# Patient Record
Sex: Female | Born: 1980 | Race: Black or African American | Hispanic: No | Marital: Single
Health system: Southern US, Community
[De-identification: ages and names within clinical notes are randomized; demographics above are authoritative.]

---

## 2019-10-19 ENCOUNTER — Emergency Department (HOSPITAL_COMMUNITY): Payer: BC Managed Care – PPO

## 2019-10-19 ENCOUNTER — Emergency Department (HOSPITAL_COMMUNITY)
Admission: EM | Admit: 2019-10-19 | Discharge: 2019-10-19 | Disposition: A | Discharge status: Home / Self Care | Payer: BC Managed Care – PPO | Attending: Emergency Medicine | Admitting: Emergency Medicine

## 2019-10-19 ENCOUNTER — Other Ambulatory Visit: Payer: Self-pay

## 2019-10-19 ENCOUNTER — Telehealth: Payer: Self-pay | Admitting: *Deleted

## 2019-10-19 DIAGNOSIS — S40811A Abrasion of right upper arm, initial encounter: Secondary | ICD-10-CM | POA: Insufficient documentation

## 2019-10-19 DIAGNOSIS — M542 Cervicalgia: Secondary | ICD-10-CM | POA: Insufficient documentation

## 2019-10-19 DIAGNOSIS — Y92410 Unspecified street and highway as the place of occurrence of the external cause: Secondary | ICD-10-CM | POA: Insufficient documentation

## 2019-10-19 DIAGNOSIS — M545 Low back pain: Secondary | ICD-10-CM | POA: Insufficient documentation

## 2019-10-19 DIAGNOSIS — R0789 Other chest pain: Secondary | ICD-10-CM | POA: Insufficient documentation

## 2019-10-19 DIAGNOSIS — S40812A Abrasion of left upper arm, initial encounter: Secondary | ICD-10-CM | POA: Diagnosis not present

## 2019-10-19 DIAGNOSIS — Y999 Unspecified external cause status: Secondary | ICD-10-CM | POA: Insufficient documentation

## 2019-10-19 DIAGNOSIS — Y9389 Activity, other specified: Secondary | ICD-10-CM | POA: Insufficient documentation

## 2019-10-19 DIAGNOSIS — Z79899 Other long term (current) drug therapy: Secondary | ICD-10-CM | POA: Insufficient documentation

## 2019-10-19 DIAGNOSIS — S4992XA Unspecified injury of left shoulder and upper arm, initial encounter: Secondary | ICD-10-CM | POA: Diagnosis present

## 2019-10-19 LAB — CBC WITH DIFFERENTIAL/PLATELET
Abs Immature Granulocytes: 0.07 10*3/uL (ref 0.00–0.07)
Basophils Absolute: 0 10*3/uL (ref 0.0–0.1)
Basophils Relative: 0 %
Eosinophils Absolute: 0 10*3/uL (ref 0.0–0.5)
Eosinophils Relative: 0 %
HCT: 38.4 % (ref 36.0–46.0)
Hemoglobin: 13.8 g/dL (ref 12.0–15.0)
Immature Granulocytes: 1 %
Lymphocytes Relative: 15 %
Lymphs Abs: 1.9 10*3/uL (ref 0.7–4.0)
MCH: 31.4 pg (ref 26.0–34.0)
MCHC: 35.9 g/dL (ref 30.0–36.0)
MCV: 87.5 fL (ref 80.0–100.0)
Monocytes Absolute: 0.5 10*3/uL (ref 0.1–1.0)
Monocytes Relative: 4 %
Neutro Abs: 9.9 10*3/uL — ABNORMAL HIGH (ref 1.7–7.7)
Neutrophils Relative %: 80 %
Platelets: 361 10*3/uL (ref 150–400)
RBC: 4.39 MIL/uL (ref 3.87–5.11)
RDW: 12.9 % (ref 11.5–15.5)
WBC: 12.4 10*3/uL — ABNORMAL HIGH (ref 4.0–10.5)
nRBC: 0 % (ref 0.0–0.2)

## 2019-10-19 LAB — URINALYSIS, ROUTINE W REFLEX MICROSCOPIC
Bilirubin Urine: NEGATIVE
Glucose, UA: NEGATIVE mg/dL
Hgb urine dipstick: NEGATIVE
Ketones, ur: 20 mg/dL — AB
Leukocytes,Ua: NEGATIVE
Nitrite: NEGATIVE
Protein, ur: 30 mg/dL — AB
Specific Gravity, Urine: 1.023 (ref 1.005–1.030)
pH: 7 (ref 5.0–8.0)

## 2019-10-19 LAB — COMPREHENSIVE METABOLIC PANEL
ALT: 19 U/L (ref 0–44)
AST: 17 U/L (ref 15–41)
Albumin: 3.9 g/dL (ref 3.5–5.0)
Alkaline Phosphatase: 56 U/L (ref 38–126)
Anion gap: 7 (ref 5–15)
BUN: 7 mg/dL (ref 6–20)
CO2: 25 mmol/L (ref 22–32)
Calcium: 9.2 mg/dL (ref 8.9–10.3)
Chloride: 107 mmol/L (ref 98–111)
Creatinine, Ser: 0.7 mg/dL (ref 0.44–1.00)
GFR calc Af Amer: >60 mL/min (ref >60)
GFR calc non Af Amer: >60 mL/min (ref >60)
Glucose, Bld: 97 mg/dL (ref 70–99)
Potassium: 3.6 mmol/L (ref 3.5–5.1)
Sodium: 139 mmol/L (ref 135–145)
Total Bilirubin: 0.9 mg/dL (ref 0.3–1.2)
Total Protein: 6.8 g/dL (ref 6.5–8.1)

## 2019-10-19 MED ORDER — CYCLOBENZAPRINE HCL 10 MG PO TABS: 10.0000 mg | ORAL_TABLET | Freq: Three times a day (TID) | ORAL | 0 refills | Status: AC

## 2019-10-19 MED ORDER — METHOCARBAMOL 500 MG PO TABS
500.0000 mg | ORAL_TABLET | Freq: Two times a day (BID) | ORAL | 0 refills | Status: DC
Start: 2019-10-19 — End: 2019-10-19

## 2019-10-19 MED ORDER — DIAZEPAM 5 MG PO TABS
5.0000 mg | ORAL_TABLET | Freq: Once | ORAL | Status: AC
  Administered 2019-10-19: 5 mg via ORAL
  Filled 2019-10-19: qty 1

## 2019-10-19 MED ORDER — NAPROXEN 500 MG PO TABS: 500.0000 mg | ORAL_TABLET | Freq: Two times a day (BID) | ORAL | 0 refills | Status: AC
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
## 2019-10-19 NOTE — Discharge Instructions (Addendum)
Your laboratory results are within normal limits today.  The CT of your neck showed no acute process.  X-ray of your lumbar spine, chest were within normal limits.  You will likely feel more sore tomorrow, please apply ice or heat to your neck and back.  I have provided a short course of anti-inflammatories along with muscle relaxers to help with symptoms.

## 2019-10-19 NOTE — Telephone Encounter (Signed)
Pharmacy called related to 2 Rx muscles relaxants...EDCM clarified with EDP ([place]) to discontinue robaxin.
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
